# Patient Record
Sex: Male | Born: 1985
Health system: Southern US, Community
[De-identification: ages and names within clinical notes are randomized; demographics above are authoritative.]

---

## 2007-12-14 ENCOUNTER — Emergency Department (HOSPITAL_COMMUNITY): Admission: EM | Admit: 2007-12-14 | Discharge: 2007-12-14 | Payer: Self-pay | Admitting: Emergency Medicine

## 2009-11-22 IMAGING — CR DG KNEE COMPLETE 4+V*R*
4 series · 4 of 4 positions shown · non-contrast
Comparison: None

CLINICAL DATA: History given of injury from fall with pain.
History given of abrasion of the anterior aspect of the knee.

RIGHT KNEE - COMPLETE 4+ VIEW

[t knee ap right]
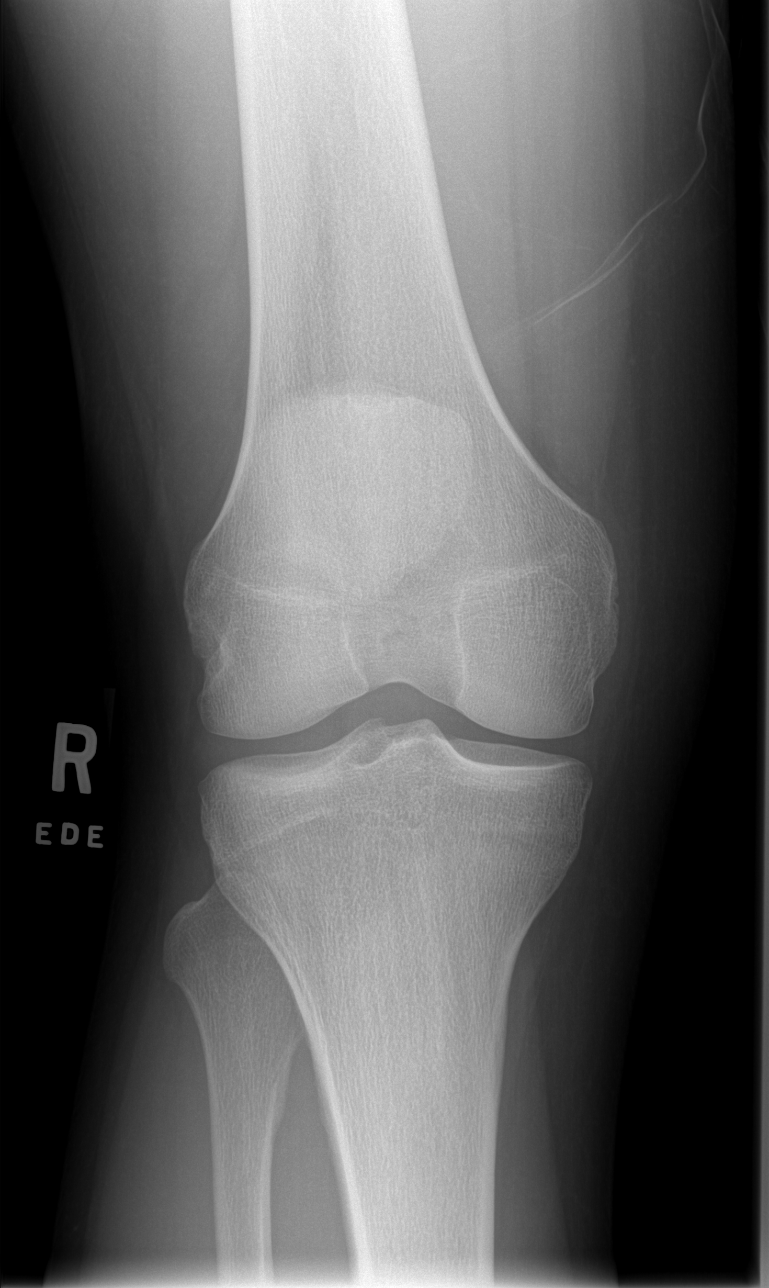

[t knee oblique right (1 of 2)]
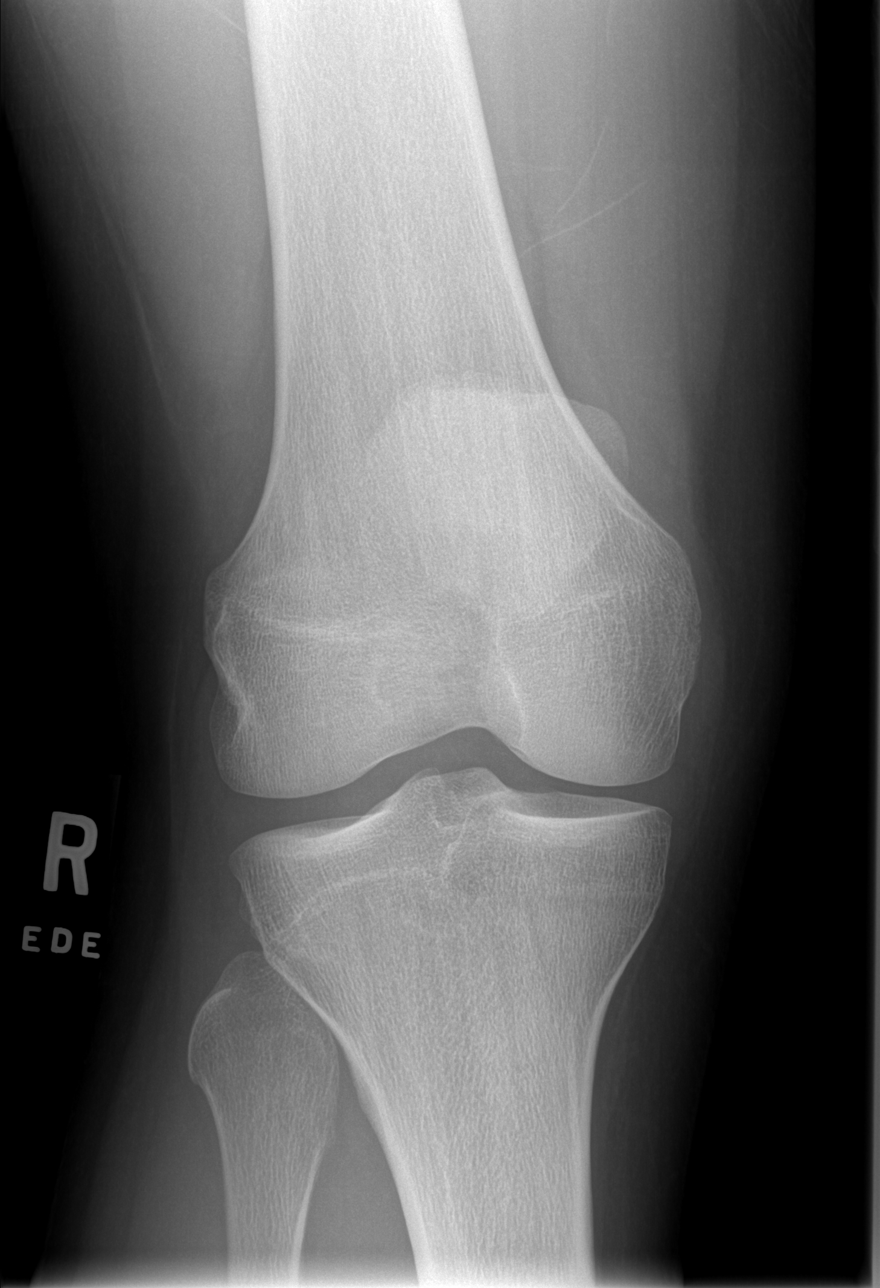

[t knee oblique right (2 of 2)]
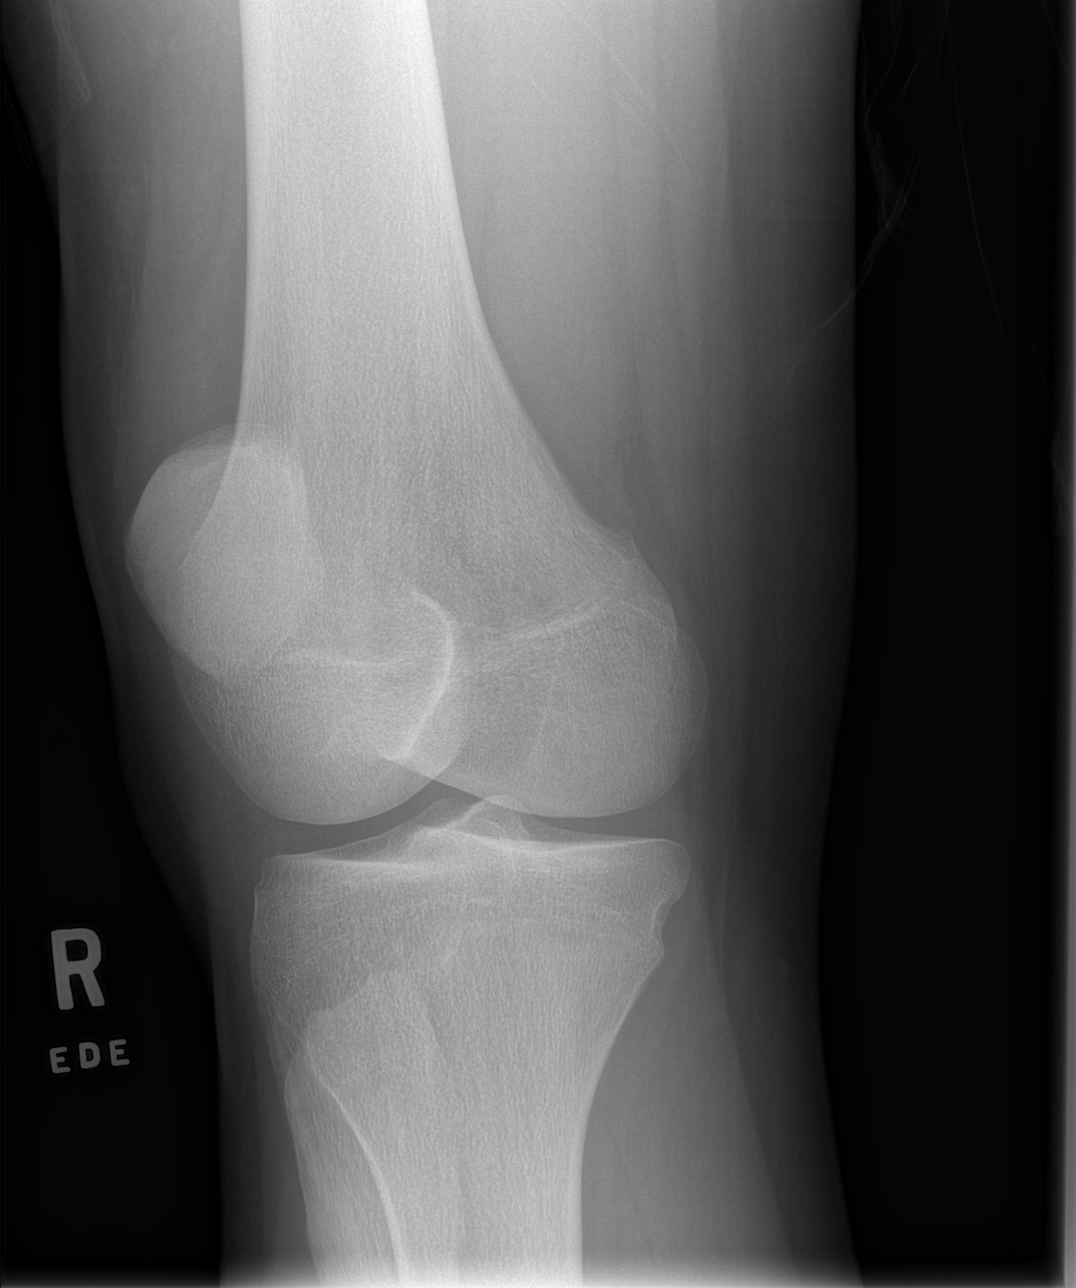

[t knee lat right]
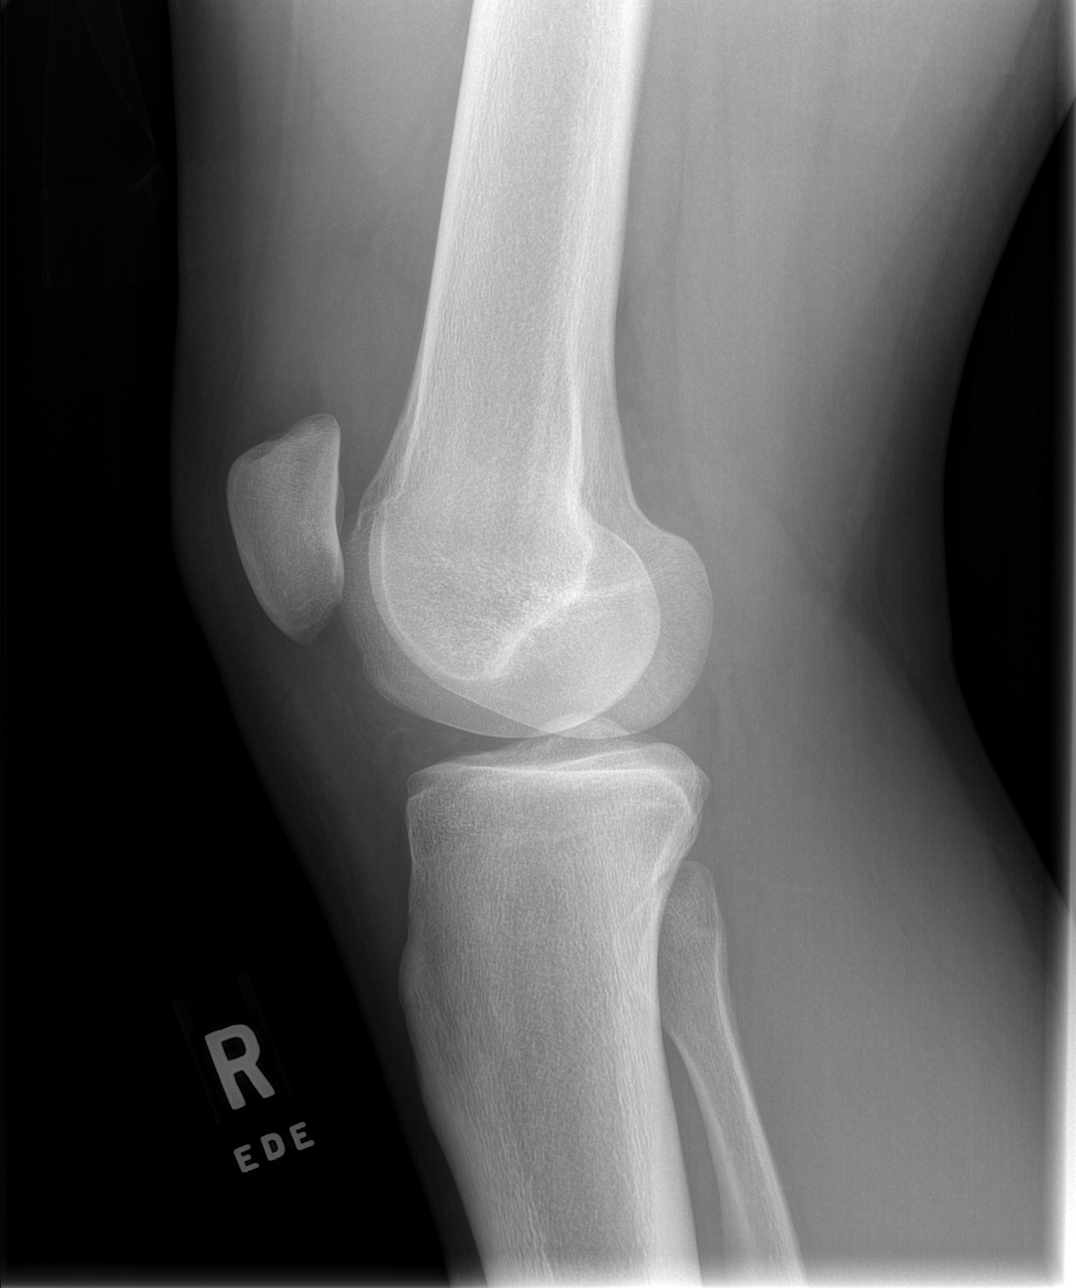

[4 of 4 positions shown; findings below may reference images not displayed]

FINDINGS: There is fullness in the anterior suprapatellar region of
the soft tissues consistent with joint effusion.  Alignment is
normal.  Joint spaces are preserved.  No fracture or dislocation is
seen.
IMPRESSION: Joint effusion.  No fracture or dislocation.  No opaque foreign
bodies seen.

## 2016-11-02 ENCOUNTER — Ambulatory Visit: Payer: Self-pay | Admitting: Family

## 2016-11-02 VITALS — BP 110/80 | HR 66 | Temp 98.5°F | Ht 77.0 in | Wt 281.0 lb

## 2016-11-02 DIAGNOSIS — Z0000 Encounter for general adult medical examination without abnormal findings: Secondary | ICD-10-CM

## 2016-11-02 NOTE — Progress Notes (Signed)
S/ 30  Y/ o [employer] x 3 yrs. C/o difficulty loosing wt. Lifts wts , little cardio. HDL under 40 , NO other complaints.  O/ VSS pleasant healthy ,ENT wnl, neck supple heart rsr lungs clear  abd nontender A/ wellness exam P Sensible eating , portion control, reading labels and increase cardio encourged.Is aware of [employer], wife uses. F/u prn

## 2017-07-27 ENCOUNTER — Ambulatory Visit: Payer: Self-pay | Admitting: Emergency Medicine

## 2017-07-27 VITALS — BP 136/74 | HR 100 | Temp 99.9°F | Resp 16

## 2017-07-27 DIAGNOSIS — J209 Acute bronchitis, unspecified: Secondary | ICD-10-CM

## 2017-07-27 DIAGNOSIS — J0100 Acute maxillary sinusitis, unspecified: Secondary | ICD-10-CM

## 2017-07-27 MED ORDER — PREDNISONE 50 MG PO TABS: 50.0000 mg | ORAL_TABLET | Freq: Every day | ORAL | 0 refills | Status: DC

## 2017-07-27 MED ORDER — FLUTICASONE PROPIONATE 50 MCG/ACT NA SUSP: NASAL | 0 refills | Status: DC

## 2017-07-27 MED ORDER — AZITHROMYCIN 250 MG PO TABS: ORAL_TABLET | ORAL | 0 refills | Status: DC

## 2017-07-27 NOTE — Patient Instructions (Addendum)
Sinusitis, Adult Sinusitis is soreness and inflammation of your sinuses. Sinuses are hollow spaces in the bones around your face. Your sinuses are located:  Around your eyes.  In the middle of your forehead.  Behind your nose.  In your cheekbones.  Your sinuses and nasal passages are lined with a stringy fluid (mucus). Mucus normally drains out of your sinuses. When your nasal tissues become inflamed or swollen, the mucus can become trapped or blocked so air cannot flow through your sinuses. This allows bacteria, viruses, and funguses to grow, which leads to infection. Sinusitis can develop quickly and last for 7?10 days (acute) or for more than 12 weeks (chronic). Sinusitis often develops after a cold. What are the causes? This condition is caused by anything that creates swelling in the sinuses or stops mucus from draining, including:  Allergies.  Asthma.  Bacterial or viral infection.  Abnormally shaped bones between the nasal passages.  Nasal growths that contain mucus (nasal polyps).  Narrow sinus openings.  Pollutants, such as chemicals or irritants in the air.  A foreign object stuck in the nose.  A fungal infection. This is rare.  What increases the risk? The following factors may make you more likely to develop this condition:  Having allergies or asthma.  Having had a recent cold or respiratory tract infection.  Having structural deformities or blockages in your nose or sinuses.  Having a weak immune system.  Doing a lot of swimming or diving.  Overusing nasal sprays.  Smoking.  What are the signs or symptoms? The main symptoms of this condition are pain and a feeling of pressure around the affected sinuses. Other symptoms include:  Upper toothache.  Earache.  Headache.  Bad breath.  Decreased sense of smell and taste.  A cough that may get worse at night.  Fatigue.  Fever.  Thick drainage from your nose. The drainage is often green and  it may contain pus (purulent).  Stuffy nose or congestion.  Postnasal drip. This is when extra mucus collects in the throat or back of the nose.  Swelling and warmth over the affected sinuses.  Sore throat.  Sensitivity to light.  How is this diagnosed? This condition is diagnosed based on symptoms, a medical history, and a physical exam. To find out if your condition is acute or chronic, your health care provider may:  Look in your nose for signs of nasal polyps.  Tap over the affected sinus to check for signs of infection.  View the inside of your sinuses using an imaging device that has a light attached (endoscope).  If your health care provider suspects that you have chronic sinusitis, you may also:  Be tested for allergies.  Have a sample of mucus taken from your nose (nasal culture) and checked for bacteria.  Have a mucus sample examined to see if your sinusitis is related to an allergy.  If your sinusitis does not respond to treatment and it lasts longer than 8 weeks, you may have an MRI or CT scan to check your sinuses. These scans also help to determine how severe your infection is. In rare cases, a bone biopsy may be done to rule out more serious types of fungal sinus disease. How is this treated? Treatment for sinusitis depends on the cause and whether your condition is chronic or acute. If a virus is causing your sinusitis, your symptoms will go away on their own within 10 days. You may be given medicines to relieve your symptoms,   including:  Topical nasal decongestants. They shrink swollen nasal passages and let mucus drain from your sinuses.  Antihistamines. These drugs block inflammation that is triggered by allergies. This can help to ease swelling in your nose and sinuses.  Topical nasal corticosteroids. These are nasal sprays that ease inflammation and swelling in your nose and sinuses.  Nasal saline washes. These rinses can help to get rid of thick mucus in  your nose.  If your condition is caused by bacteria, you will be given an antibiotic medicine. If your condition is caused by a fungus, you will be given an antifungal medicine. Surgery may be needed to correct underlying conditions, such as narrow nasal passages. Surgery may also be needed to remove polyps. Follow these instructions at home: Medicines  Take, use, or apply over-the-counter and prescription medicines only as told by your health care provider. These may include nasal sprays.  If you were prescribed an antibiotic medicine, take it as told by your health care provider. Do not stop taking the antibiotic even if you start to feel better. Hydrate and Humidify  Drink enough water to keep your urine clear or pale yellow. Staying hydrated will help to thin your mucus.  Use a cool mist humidifier to keep the humidity level in your home above 50%.  Inhale steam for 10-15 minutes, 3-4 times a day or as told by your health care provider. You can do this in the bathroom while a hot shower is running.  Limit your exposure to cool or dry air. Rest  Rest as much as possible.  Sleep with your head raised (elevated).  Make sure to get enough sleep each night. General instructions  Apply a warm, moist washcloth to your face 3-4 times a day or as told by your health care provider. This will help with discomfort.  Wash your hands often with soap and water to reduce your exposure to viruses and other germs. If soap and water are not available, use hand sanitizer.  Do not smoke. Avoid being around people who are smoking (secondhand smoke).  Keep all follow-up visits as told by your health care provider. This is important. Contact a health care provider if:  You have a fever.  Your symptoms get worse.  Your symptoms do not improve within 10 days. Get help right away if:  You have a severe headache.  You have persistent vomiting.  You have pain or swelling around your face or  eyes.  You have vision problems.  You develop confusion.  Your neck is stiff.  You have trouble breathing. This information is not intended to replace advice given to you by your health care provider. Make sure you discuss any questions you have with your health care provider. Document Released: 05/24/2005 Document Revised: 01/18/2016 Document Reviewed: 03/19/2015 Elsevier Interactive Patient Education  2018 [employer].  Acute Bronchitis, Adult Acute bronchitis is when air tubes (bronchi) in the lungs suddenly get swollen. The condition can make it hard to breathe. It can also cause these symptoms:  A cough.  Coughing up clear, yellow, or green mucus.  Wheezing.  Chest congestion.  Shortness of breath.  A fever.  Body aches.  Chills.  A sore throat.  Follow these instructions at home: Medicines  Take over-the-counter and prescription medicines only as told by your doctor.  If you were prescribed an antibiotic medicine, take it as told by your doctor. Do not stop taking the antibiotic even if you start to feel better. General instructions  Rest.  Drink enough fluids to keep your pee (urine) clear or pale yellow.  Avoid smoking and secondhand smoke. If you smoke and you need help quitting, ask your doctor. Quitting will help your lungs heal faster.  Use an inhaler, cool mist vaporizer, or humidifier as told by your doctor.  Keep all follow-up visits as told by your doctor. This is important. How is this prevented? To lower your risk of getting this condition again:  Wash your hands often with soap and water. If you cannot use soap and water, use hand sanitizer.  Avoid contact with people who have cold symptoms.  Try not to touch your hands to your mouth, nose, or eyes.  Make sure to get the flu shot every year.  Contact a doctor if:  Your symptoms do not get better in 2 weeks. Get help right away if:  You cough up blood.  You have chest  pain.  You have very bad shortness of breath.  You become dehydrated.  You faint (pass out) or keep feeling like you are going to pass out.  You keep throwing up (vomiting).  You have a very bad headache.  Your fever or chills gets worse. This information is not intended to replace advice given to you by your health care provider. Make sure you discuss any questions you have with your health care provider. Document Released: 11/10/2007 Document Revised: 12/31/2015 Document Reviewed: 11/12/2015 Elsevier Interactive Patient Education  [employer]2018 [employer]

## 2017-07-27 NOTE — Progress Notes (Signed)
Subjective: 4 weeks ago, started with sinus congestion, mild discolored rhinorrhea, without fever. Then, about 7 days ago, new symptoms of severe sinus congestion, discolored rhinorrhea, facial pain, low-grade fever without definite chills, associated with progression to chest congestion and cough occasionally productive of discolored sputum. No history of chronic lung disease. He denies sore throat, nausea or vomiting or chest pain or shortness of breath or abdominal pain or diarrhea or GU symptoms. Meds: None Past medical history negative for chronic disease. I questioned him about medication allergies, he states was told that he had penicillin allergy as a child and is never since taken penicillin.  Objective: Vital signs noted. Temp 99.9, repeated pulse 88, regular. In general: Pleasant male, appears mildly ill but not toxic. No acute cardiorespiratory distress. Occasional mild cough noted. HEENT: TMs normal except minimal air-fluid levels bilaterally. Nose: Boggy turbinates with seromucoid drainage Face: Mildly tender over maxillary sinuses bilaterally. Oropharynx: Clear without redness or lesions. Airway intact. No exudate Neck: Supple, nontender. No adenopathy. Lungs: Few anterior rhonchi. No wheezes except scant late expiratory wheeze heard once anteriorly on forced expiration. Lungs otherwise clear. Breath sounds equal and equal expansion bilaterally. No rhonchi or rales. Heart, regular rate and rhythm Skin: Warm, dry, no rash  Assessment: Likely had viral prodrome and/or seasonal allergy prodrome starting 4 weeks ago, but new symptoms the past 1 week are likely bacterial bronchitis and bacterial sinusitis.  Plans: Treatment options discussed, as well as risks, benefits, alternatives. Patient voiced understanding and agreement with the following plans: New Prescriptions   AZITHROMYCIN (ZITHROMAX Z-PAK) 250 MG TABLET    Take 2 tablets on day one, then 1 tablet daily on days 2  through 5   FLUTICASONE (FLONASE) 50 MCG/ACT NASAL SPRAY    1 or 2 sprays each nostril twice a day   PREDNISONE (DELTASONE) 50 MG TABLET    Take 1 tablet (50 mg total) by mouth daily. With food for 5 days.   Other symptomatic care discussed. An After Visit Summary was printed and given to the patient. Follow-up with your primary care doctor in 5-7 days if not improving, or sooner if symptoms become worse. Precautions discussed. Red flags discussed. Questions invited and answered. Patient voiced understanding and agreement.

## 2017-09-13 ENCOUNTER — Encounter: Payer: Self-pay | Admitting: Emergency Medicine

## 2017-09-13 ENCOUNTER — Emergency Department
Admission: EM | Admit: 2017-09-13 | Discharge: 2017-09-13 | Disposition: A | Discharge status: Home / Self Care | Payer: No Typology Code available for payment source | Attending: Emergency Medicine | Admitting: Emergency Medicine

## 2017-09-13 DIAGNOSIS — Y9289 Other specified places as the place of occurrence of the external cause: Secondary | ICD-10-CM | POA: Diagnosis not present

## 2017-09-13 DIAGNOSIS — Y990 Civilian activity done for income or pay: Secondary | ICD-10-CM | POA: Insufficient documentation

## 2017-09-13 DIAGNOSIS — S6992XA Unspecified injury of left wrist, hand and finger(s), initial encounter: Secondary | ICD-10-CM | POA: Diagnosis present

## 2017-09-13 DIAGNOSIS — Z7721 Contact with and (suspected) exposure to potentially hazardous body fluids: Secondary | ICD-10-CM | POA: Diagnosis not present

## 2017-09-13 DIAGNOSIS — W25XXXA Contact with sharp glass, initial encounter: Secondary | ICD-10-CM | POA: Diagnosis not present

## 2017-09-13 DIAGNOSIS — Y9389 Activity, other specified: Secondary | ICD-10-CM | POA: Insufficient documentation

## 2017-09-13 DIAGNOSIS — S61217A Laceration without foreign body of left little finger without damage to nail, initial encounter: Secondary | ICD-10-CM | POA: Insufficient documentation

## 2017-09-13 DIAGNOSIS — Z23 Encounter for immunization: Secondary | ICD-10-CM | POA: Insufficient documentation

## 2017-09-13 LAB — COMPREHENSIVE METABOLIC PANEL
ALBUMIN: 4.7 g/dL (ref 3.5–5.0)
ALT: 44 U/L (ref 17–63)
ANION GAP: 7 (ref 5–15)
AST: 34 U/L (ref 15–41)
Alkaline Phosphatase: 67 U/L (ref 38–126)
BUN: 17 mg/dL (ref 6–20)
CO2: 25 mmol/L (ref 22–32)
Calcium: 9.3 mg/dL (ref 8.9–10.3)
Chloride: 106 mmol/L (ref 101–111)
Creatinine, Ser: 0.98 mg/dL (ref 0.61–1.24)
GFR calc non Af Amer: >60 mL/min (ref >60)
GLUCOSE: 103 mg/dL — AB (ref 65–99)
POTASSIUM: 3.9 mmol/L (ref 3.5–5.1)
SODIUM: 138 mmol/L (ref 135–145)
Total Bilirubin: 0.9 mg/dL (ref 0.3–1.2)
Total Protein: 7.6 g/dL (ref 6.5–8.1)

## 2017-09-13 LAB — CBC WITH DIFFERENTIAL/PLATELET
BASOS PCT: 1 %
Basophils Absolute: 0 10*3/uL (ref 0–0.1)
EOS ABS: 0.1 10*3/uL (ref 0–0.7)
Eosinophils Relative: 2 %
HCT: 47.3 % (ref 40.0–52.0)
Hemoglobin: 16.1 g/dL (ref 13.0–18.0)
Lymphocytes Relative: 28 %
Lymphs Abs: 1.9 10*3/uL (ref 1.0–3.6)
MCH: 29 pg (ref 26.0–34.0)
MCHC: 33.9 g/dL (ref 32.0–36.0)
MCV: 85.6 fL (ref 80.0–100.0)
MONO ABS: 0.5 10*3/uL (ref 0.2–1.0)
MONOS PCT: 7 %
Neutro Abs: 4.3 10*3/uL (ref 1.4–6.5)
Neutrophils Relative %: 62 %
Platelets: 202 10*3/uL (ref 150–440)
RBC: 5.53 MIL/uL (ref 4.40–5.90)
RDW: 13.6 % (ref 11.5–14.5)
WBC: 6.8 10*3/uL (ref 3.8–10.6)

## 2017-09-13 MED ORDER — TETANUS-DIPHTH-ACELL PERTUSSIS 5-2.5-18.5 LF-MCG/0.5 IM SUSP
0.5000 mL | Freq: Once | INTRAMUSCULAR | Status: AC
  Administered 2017-09-13: 0.5 mL via INTRAMUSCULAR
  Filled 2017-09-13: qty 0.5

## 2017-09-13 NOTE — ED Triage Notes (Signed)
States he was trying to get someone out of the car  And he got a small skin tear to left 5 th finger  The person was positive for hep C

## 2017-09-13 NOTE — ED Provider Notes (Signed)
[facility] Emergency Department Provider Note  ___________________________________________   First MD Initiated Contact with Patient 09/13/17 1320     (approximate)  I have reviewed the triage vital signs and the nursing notes.   HISTORY  Chief Complaint Hand Injury  HPI Joshua Hayden is a 32 y.o. male is here with a Workmen's Comp. injury.  Patient states that he was getting someone out of his car and was exposed to the suspect's blood.  Suspect is known to be hep C positive.  Patient while getting the suspect out of the car had a small flap tear to his left fifth finger.  He believes that this occurred when he reached in the suspects glass window.  He immediately washed his hands and states that his finger bled quite a bit.  Bleeding is now under control.  Patient is unsure of his last tetanus immunization.  He denies any pain with his finger at this time.  History reviewed. No pertinent past medical history.  There are no active problems to display for this patient.   History reviewed. No pertinent surgical history.  Prior to Admission medications   Not on File    Allergies Penicillins  No family history on file.  Social History Social History   Tobacco Use  . Smoking status: Never Smoker  . Smokeless tobacco: Never Used  Substance Use Topics  . Alcohol use: Not on file  . Drug use: Not on file    Review of Systems Constitutional: No fever/chills Cardiovascular: Denies chest pain. Respiratory: Denies shortness of breath. Skin: Positive for laceration left hand. Neurological: Negative for headaches, focal weakness or numbness. ____________________________________________   PHYSICAL EXAM:  VITAL SIGNS: ED Triage Vitals  Enc Vitals Group     BP 09/13/17 1231 (!) 147/79     Pulse Rate 09/13/17 1231 68     Resp --      Temp 09/13/17 1231 97.7 F (36.5 C)     Temp Source 09/13/17 1231 Oral     SpO2 09/13/17 1231 95 %     Weight  09/13/17 1232 275 lb (124.7 kg)     Height 09/13/17 1232 6\' 5"  (1.956 m)     Head Circumference --      Peak Flow --      Pain Score 09/13/17 1237 0     Pain Loc --      Pain Edu? --      Excl. in GC? --    Constitutional: Alert and oriented. Well appearing and in no acute distress. Eyes: Conjunctivae are normal.  Head: Atraumatic. Neck: No stridor.   Cardiovascular: Normal rate, regular rhythm. Grossly normal heart sounds.  Good peripheral circulation. Respiratory: Normal respiratory effort.  No retractions. Lungs CTAB. Musculoskeletal: No lower extremity tenderness nor edema.  No joint effusions. Neurologic:  Normal speech and language. No gross focal neurologic deficits are appreciated.  Skin:  Skin is warm, dry.  There is a 1 cm flap laceration to the left fifth finger lateral aspect.  No active bleeding and no foreign body was noted.  Flap is in good alignment and superficial.  No sutures were indicated.  Nail is not involved with the injury. Psychiatric: Mood and affect are normal. Speech and behavior are normal. ____________________________________________   LABS (all labs ordered are listed, but only abnormal results are displayed)  Labs Reviewed  COMPREHENSIVE METABOLIC PANEL - Abnormal; Notable for the following components:      Result Value   Glucose,  Bld 103 (*)    All other components within normal limits  CBC WITH DIFFERENTIAL/PLATELET    PROCEDURES  Procedure(s) performed: None  Procedures  Critical Care performed: No  ____________________________________________   INITIAL IMPRESSION / ASSESSMENT AND PLAN / ED COURSE Patient here after receiving a flap laceration to his left fifth finger while in the line of duty.  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  Suspect that patient was dealing with is known to be positive for hep C.  Baseline labs were drawn on the patient.  Patient is to follow-up with the provider responsible [facility] department  for his [employer]. injury.  Dressing was applied and patient is to watch area for any signs of infection.  ____________________________________________   FINAL CLINICAL IMPRESSION(S) / ED DIAGNOSES  Final diagnoses:  Laceration of left little finger without foreign body without damage to nail, initial encounter  Exposure to blood-borne pathogen     ED Discharge Orders    None       Note:  This document was prepared using Dragon voice recognition software and may include unintentional dictation errors.    Tommi Rumps, PA-C 09/13/17 1617    Minna Antis, MD 09/16/17 1118

## 2017-09-13 NOTE — ED Notes (Signed)
Pt verbalized understanding of discharge instructions. NAD at this time. 

## 2017-09-13 NOTE — Discharge Instructions (Signed)
Follow-up with your Workmen's Comp. doctor.  If lab work that we did today is positive for anything you will be contacted immediately for prophylactic medication. Keep wound clean and dry.  Watch for any signs of infection.  If you see any signs of infection follow-up with your Workmen's Comp. doctor immediately.

## 2017-11-11 ENCOUNTER — Ambulatory Visit: Payer: Self-pay | Admitting: Family Medicine

## 2017-11-11 VITALS — BP 129/80 | HR 78 | Resp 16 | Ht 77.0 in | Wt 290.0 lb

## 2017-11-11 DIAGNOSIS — Z008 Encounter for other general examination: Secondary | ICD-10-CM

## 2017-11-11 DIAGNOSIS — Z0189 Encounter for other specified special examinations: Principal | ICD-10-CM

## 2017-11-11 NOTE — Progress Notes (Signed)
Subjective: Annual biometrics screening  Patient presents for his annual biometric screening. Patient reports eating a healthy, well-rounded diet and getting regular exercise.  Patient does not regularly see a primary care provider. PCP: None currently. 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 Patient denies any other issues or concerns.   Review of Systems Unremarkable  Objective  Physical Exam General: Awake, alert and oriented. No acute distress. Well developed, hydrated and nourished. Appears stated age.  HEENT: Supple neck without adenopathy. Sclera is non-icteric. The ear canal is clear without discharge. The tympanic membrane is normal in appearance with normal landmarks and cone of light. Nasal mucosa is pink and moist. Oral mucosa is pink and moist. The pharynx is normal in appearance without tonsillar swelling or exudates.  Skin: Skin in warm, dry and intact without rashes or lesions. Appropriate color for ethnicity. Cardiac: Heart rate and rhythm are normal. No murmurs, gallops, or rubs are auscultated.  Respiratory: The chest wall is symmetric and without deformity. No signs of respiratory distress. Lung sounds are clear in all lobes bilaterally without rales, ronchi, or wheezes.  Neurological: The patient is awake, alert and oriented to person, place, and time with normal speech.  Memory is normal and thought processes intact. No gait abnormalities are appreciated.  Psychiatric: Appropriate mood and affect.   Assessment Annual biometrics screening   Plan  Lipid panel and fasting blood sugar pending. Encouraged routine visits with primary care provider.  Provided patient with local resources and encouraged him to establish care with a new primary care provider. Encouraged patient to get regular exercise and eat a healthy, well-rounded diet.

## 2017-11-12 LAB — LIPID PANEL
CHOL/HDL RATIO: 4.9 ratio (ref 0.0–5.0)
CHOLESTEROL TOTAL: 183 mg/dL (ref 100–199)
HDL: 37 mg/dL — AB (ref >39)
LDL Calculated: 128 mg/dL — ABNORMAL HIGH (ref 0–99)
TRIGLYCERIDES: 89 mg/dL (ref 0–149)
VLDL Cholesterol Cal: 18 mg/dL (ref 5–40)

## 2017-11-12 LAB — GLUCOSE, RANDOM: Glucose: 95 mg/dL (ref 65–99)

## 2017-11-14 NOTE — Progress Notes (Signed)
Carollee HerterShannon, Will you call the patient and inform them that their lipid panel and fasting blood sugar came back?  Everything is normal, with the exception of his HDL cholesterol and LDL cholesterol. The HDL cholesterol ("good cholesterol") is decreased at 37, normal values are above 39.  The LDL cholesterol ("bad cholesterol") is elevated at 128, normal values are below 99. Please advise the patient to follow-up with their primary care provider regarding these results.

## 2018-12-27 ENCOUNTER — Other Ambulatory Visit: Payer: Self-pay

## 2018-12-27 ENCOUNTER — Ambulatory Visit: Payer: Managed Care, Other (non HMO) | Admitting: Adult Health

## 2018-12-27 ENCOUNTER — Encounter: Payer: Self-pay | Admitting: Adult Health

## 2018-12-27 VITALS — BP 128/82 | HR 77 | Temp 98.3°F | Resp 16 | Ht 77.0 in | Wt 289.0 lb

## 2018-12-27 DIAGNOSIS — Z008 Encounter for other general examination: Secondary | ICD-10-CM | POA: Diagnosis not present

## 2018-12-27 DIAGNOSIS — Z0189 Encounter for other specified special examinations: Secondary | ICD-10-CM

## 2018-12-27 NOTE — Patient Instructions (Signed)
The Biometric exam is a brief physical with labs including glucose and cholesterol. This does not replace a full physical with a primary care provider, and additional recommended labs. This is an acute care clinic not for maintenance of chronic or long standing conditions.   Provider also recommends if you do not have a primary care provider for patient to establish care promptly.You can choose any provider of your choice at any facility of your choice, the below information is  just a resource to aid in you finding a primary care provider for routine health maintenance.   [place]  PHYSICIAN/PROVIDER  REFERRAL LINE at [phone]  WWW..COM to help assist with finding a primary care doctor.   Helpful resources below of other primary care office's accepting new patients.   . [facility]         [address]  [place]. [place] [address] [phone]  . [facility]    [address], [address] [place], [place] [address] [phone]  . [facility] [address]  [place], [place]  [phone]   . [facility]  [address], [address]  [place] [address] [phone]    Follow up with primary care as needed for chronic and maintenance health care- can be seen in this employee clinic for acute care.     Health Maintenance, Male Adopting a healthy lifestyle and getting preventive care are important in promoting health and wellness. Ask your health care provider about:  The right schedule for you to have regular tests and exams.  Things you can do on your own to prevent diseases and keep yourself healthy. What should I know about diet, weight, and exercise? Eat a healthy diet   Eat a diet that includes plenty of vegetables, fruits, low-fat dairy products, and lean protein.  Do not eat a lot of foods that are high in solid fats, added sugars, or sodium.  Maintain a healthy weight Body mass index (BMI) is a measurement that can be used to identify possible weight problems. It estimates body fat based on height and weight. Your health care provider can help determine your BMI and help you achieve or maintain a healthy weight. Get regular exercise Get regular exercise. This is one of the most important things you can do for your health. Most adults should:  Exercise for at least 150 minutes each week. The exercise should increase your heart rate and make you sweat (moderate-intensity exercise).  Do strengthening exercises at least twice a week. This is in addition to the moderate-intensity exercise.  Spend less time sitting. Even light physical activity can be beneficial. Watch cholesterol and blood lipids Have your blood tested for lipids and cholesterol at 33 years of age, then have this test every 5 years. You may need to have your cholesterol levels checked more often if:  Your lipid or cholesterol levels are high.  You are older than 33 years of age.  You are at high risk for heart disease. What should I know about cancer screening? Many types of cancers can be detected early and may often be prevented. Depending on your health history and family history, you may need to have cancer screening at various ages. This may include screening for:  Colorectal cancer.  Prostate cancer.  Skin cancer.  Lung cancer. What should I know about heart disease, diabetes, and high blood pressure? Blood pressure and heart disease    High blood pressure causes heart disease and increases the risk of stroke. This is more likely to develop in people who have high blood pressure readings, are of African descent, or are overweight.  Talk with your health care provider about your target blood pressure readings.  Have your blood pressure checked: ? Every 3-5 years if you are 18-39 years of age. ? Every year if you are 40 years old or older.  If you  are between the ages of 65 and 75 and are a current or former smoker, ask your health care provider if you should have a one-time screening for abdominal aortic aneurysm (AAA). Diabetes Have regular diabetes screenings. This checks your fasting blood sugar level. Have the screening done:  Once every three years after age 45 if you are at a normal weight and have a low risk for diabetes.  More often and at a younger age if you are overweight or have a high risk for diabetes. What should I know about preventing infection? Hepatitis B If you have a higher risk for hepatitis B, you should be screened for this virus. Talk with your health care provider to find out if you are at risk for hepatitis B infection. Hepatitis C Blood testing is recommended for:  Everyone born from 1945 through 1965.  Anyone with known risk factors for hepatitis C. Sexually transmitted infections (STIs)  You should be screened each year for STIs, including gonorrhea and chlamydia, if: ? You are sexually active and are younger than 33 years of age. ? You are older than 33 years of age and your health care provider tells you that you are at risk for this type of infection. ? Your sexual activity has changed since you were last screened, and you are at increased risk for chlamydia or gonorrhea. Ask your health care provider if you are at risk.  Ask your health care provider about whether you are at high risk for HIV. Your health care provider may recommend a prescription medicine to help prevent HIV infection. If you choose to take medicine to prevent HIV, you should first get tested for HIV. You should then be tested every 3 months for as long as you are taking the medicine. Follow these instructions at home: Lifestyle  Do not use any products that contain nicotine or tobacco, such as cigarettes, e-cigarettes, and chewing tobacco. If you need help quitting, ask your health care provider.  Do not use street drugs.  Do  not share needles.  Ask your health care provider for help if you need support or information about quitting drugs. Alcohol use  Do not drink alcohol if your health care provider tells you not to drink.  If you drink alcohol: ? Limit how much you have to 0-2 drinks a day. ? Be aware of how much alcohol is in your drink. In the [place] one drink equals one 12 oz bottle of beer (355 mL), one 5 oz glass of wine (148 mL), or one 1 oz glass of hard liquor (44 mL). General instructions  Schedule regular health, dental, and eye exams.  Stay current with your vaccines.  Tell your health care provider if: ? You often feel depressed. ? You have ever been abused or do not feel safe at home. Summary  Adopting a healthy lifestyle and getting preventive care are important in promoting health and wellness.  Follow your health care provider's instructions about healthy diet, exercising, and getting tested or screened for diseases.  Follow   your health care provider's instructions on monitoring your cholesterol and blood pressure. This information is not intended to replace advice given to you by your health care provider. Make sure you discuss any questions you have with your health care provider. Document Released: 11/20/2007 Document Revised: 05/17/2018 Document Reviewed: 05/17/2018 Elsevier Patient Education  2020 Elsevier Inc.  

## 2018-12-27 NOTE — Progress Notes (Signed)
[place] DOB: 33 y.o. MRN: [id number]  Subjective:  Here for Biometric Screen/brief exam He is a 33 year old male here for biometric screening and brief exam. He works for [employer] and is doing well he reports. 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   He tries to remain active and does exercise. His diet is ok but has room for improvement he reports.  He denies any health concerns at this time.   Patient  denies any fever, body aches,chills, rash, chest pain, shortness of breath, nausea, vomiting, or diarrhea.    Objective: Blood pressure 128/82, pulse 77, temperature 98.3 F (36.8 C), temperature source Oral, resp. rate 16, height 6\' 5"  (1.956 m), weight 289 lb (131.1 kg), SpO2 97 %. NAD Patient is alert and oriented and responsive to questions Engages in eye contact with provider. Speaks in full sentences without any pauses without any shortness of breath or distress.   HEENT: Within normal limits Neck: Normal, supple  Heart: Regular rate and rhythm Lungs: Clear with no adventitious lung sounds.  Patient moves on and off of exam table and in room without difficulty. Gait is normal in hall and in room.  Assessment: Biometric screen   Plan:  Follow up with primary care as needed for chronic and maintenance health care- can be seen in this employee clinic for acute care.   Fasting glucose and lipids. Discussed with patient that today's visit here is a limited biometric screening visit (not a comprehensive exam or management of any chronic problems) Discussed some health issues, including healthy eating habits and exercise. Encouraged to follow-up with PCP for annual comprehensive preventive and wellness care (and if applicable, any chronic issues). Questions invited and answered.  The Biometric exam is a brief physical with labs including glucose and cholesterol. This does not  replace a full physical with a primary care provider, and additional recommended labs. This is an acute care clinic not for maintenance of chronic or long standing conditions.   Provider also recommends if you do not have a primary care provider for patient to establish care promptly.You can choose any provider of your choice at any facility of your choice, the below information is  just a resource to aid in you finding a primary care provider for routine health maintenance.   [place]  PHYSICIAN/PROVIDER  REFERRAL LINE at [phone]  [email or web address] to help assist with finding a primary care doctor.   Helpful resources below of other primary care office's accepting new patients.   [facility]         [address]  [place]. [place] [address] [phone]  . [facility]    [address] [place], [place] [address] [phone]  . [facility] [address] [place], [place]  [phone]   . [place] [facility]  [address] [place]  [address] [phone]    Follow up with primary care as needed for chronic and maintenance health care- can be seen in this employee clinic for acute care.

## 2018-12-28 LAB — LIPID PANEL WITH LDL/HDL RATIO
Cholesterol, Total: 213 mg/dL — ABNORMAL HIGH (ref 100–199)
HDL: 36 mg/dL — ABNORMAL LOW (ref >39)
LDL Calculated: 158 mg/dL — ABNORMAL HIGH (ref 0–99)
LDl/HDL Ratio: 4.4 ratio — ABNORMAL HIGH (ref 0.0–3.6)
Triglycerides: 95 mg/dL (ref 0–149)
VLDL Cholesterol Cal: 19 mg/dL (ref 5–40)

## 2018-12-28 LAB — GLUCOSE, RANDOM: Glucose: 86 mg/dL (ref 65–99)

## 2019-01-01 ENCOUNTER — Telehealth: Payer: Self-pay

## 2019-01-01 NOTE — Telephone Encounter (Signed)
Spoke with patient and informed him of his lab results as well as the recommendations of the reviewing Provider Sharyn Lull Flinchum FNP-C. Patient gave verbal understanding

## 2019-01-01 NOTE — Progress Notes (Signed)
Tried calling patient 01/01/19 to review results. No active My chart- code was sent to cell.  Cholesterol elevated 213 as normal range is  (100-199),  Triglycerides elevated normal. HDL your good cholesterol is 36 this is low, as above 39 is normal, and the higher the better for this protective level. Your LDL ( bad cholesterol) is elevated at 158 normal is 0-99. These levels increase your risk of cardiovascular disease/ events in the future. Provider recommends increased exercise daily at least 30 minutes, decreased cholesterol and heart healthy diet and follow up with primary care for regular male health maintenance.

## 2019-01-01 NOTE — Telephone Encounter (Signed)
-----   Message from Doreen Beam, [place] sent at 01/01/2019 10:50 AM EDT ----- Tried calling patient 01/01/19 to review results. No active My chart- code was sent to cell.  Cholesterol elevated 213 as normal range is  (100-199),  Triglycerides elevated normal. HDL your good cholesterol is 36 this is low, as above 39 is normal, and the higher the better for this protective level. Your LDL ( bad cholesterol) is elevated at 158 normal is 0-99. These levels increase your risk of cardiovascular disease/ events in the future. Provider recommends increased exercise daily at least 30 minutes, decreased cholesterol and heart healthy diet and follow up with primary care for regular male health maintenance.

## 2019-10-31 ENCOUNTER — Ambulatory Visit: Payer: Managed Care, Other (non HMO) | Admitting: Emergency Medicine

## 2019-10-31 ENCOUNTER — Encounter: Payer: Managed Care, Other (non HMO) | Admitting: Emergency Medicine

## 2019-10-31 ENCOUNTER — Encounter: Payer: Self-pay | Admitting: Emergency Medicine

## 2019-10-31 ENCOUNTER — Other Ambulatory Visit: Payer: Self-pay

## 2019-10-31 VITALS — BP 140/80 | HR 63 | Temp 97.6°F | Resp 18 | Ht 74.0 in | Wt 290.0 lb

## 2019-10-31 DIAGNOSIS — Z008 Encounter for other general examination: Secondary | ICD-10-CM

## 2019-10-31 NOTE — Progress Notes (Signed)
   I have reviewed the triage vital signs and the nursing notes.   HISTORY  Chief Complaint Biometrics  HPI Joshua Hayden is a 34 y.o. male presents to clinic for a biometric physical. Had recent GB surgery with 100 gallstones.  Did not have any complications.  Just recently returned to work.        History reviewed. No pertinent past medical history.  There are no problems to display for this patient.   History reviewed. No pertinent surgical history.  Prior to Admission medications   Not on File    Allergies Penicillins  History reviewed. No pertinent family history.  Social History Social History   Tobacco Use  . Smoking status: Never Smoker  . Smokeless tobacco: Never Used  Substance Use Topics  . Alcohol use: Not on file  . Drug use: Not on file    Review of Systems Constitutional: No fever/chills Eyes: No visual changes. ENT: No sore throat. Cardiovascular: Denies chest pain. Respiratory: Denies shortness of breath. Gastrointestinal: No abdominal pain.  No nausea, no vomiting.  Musculoskeletal: Negative for muscle skeletal pain. Skin: Negative for rash. Neurological: Negative for headaches, focal weakness or numbness.  ____________________________________________   PHYSICAL EXAM: Constitutional: Alert and oriented. Well appearing and in no acute distress. Eyes: Conjunctivae are normal. PERRL. EOMI. Head: Atraumatic. Neck: No stridor.  Cardiovascular: Normal rate, regular rhythm. Grossly normal heart sounds.  Good peripheral circulation. Respiratory: Normal respiratory effort.  No retractions. Lungs CTAB. Gastrointestinal: Soft and nontender. No distention. Bowel sounds x 4 quadrants within normal limits. Musculoskeletal: Moves upper and lower extremities without any difficulty.  No joint effusions. Neurologic:  Normal speech and language. No gross focal neurologic deficits are appreciated. No gait instability. Skin:  Skin is warm, dry and intact.  No rash noted. Psychiatric: Mood and affect are normal. Speech and behavior are normal.   FINAL CLINICAL IMPRESSION(S)  Normal male biometric exam   ED Discharge Orders         Ordered    Lipid panel     Pending    Glucose, random     Pending           Note:  This document was prepared using Dragon voice recognition software and may include unintentional dictation errors.

## 2019-11-01 LAB — LIPID PANEL
Chol/HDL Ratio: 5.3 ratio — ABNORMAL HIGH (ref 0.0–5.0)
Cholesterol, Total: 196 mg/dL (ref 100–199)
HDL: 37 mg/dL — ABNORMAL LOW (ref >39)
LDL Chol Calc (NIH): 136 mg/dL — ABNORMAL HIGH (ref 0–99)
Triglycerides: 127 mg/dL (ref 0–149)
VLDL Cholesterol Cal: 23 mg/dL (ref 5–40)

## 2019-11-01 LAB — GLUCOSE, RANDOM: Glucose: 93 mg/dL (ref 65–99)

## 2020-05-13 ENCOUNTER — Emergency Department
Admission: EM | Admit: 2020-05-13 | Discharge: 2020-05-13 | Disposition: A | Discharge status: Home / Self Care | Payer: BC Managed Care – PPO | Attending: Emergency Medicine | Admitting: Emergency Medicine

## 2020-05-13 ENCOUNTER — Other Ambulatory Visit: Payer: Self-pay

## 2020-05-13 DIAGNOSIS — Z203 Contact with and (suspected) exposure to rabies: Secondary | ICD-10-CM | POA: Insufficient documentation

## 2020-05-13 DIAGNOSIS — Z23 Encounter for immunization: Secondary | ICD-10-CM | POA: Insufficient documentation

## 2020-05-13 DIAGNOSIS — Z2914 Encounter for prophylactic rabies immune globin: Secondary | ICD-10-CM | POA: Diagnosis not present

## 2020-05-13 MED ORDER — RABIES IMMUNE GLOBULIN 150 UNIT/ML IM INJ
20.0000 [IU]/kg | INJECTION | Freq: Once | INTRAMUSCULAR | Status: AC
Start: 2020-05-13 — End: 2020-05-13
  Administered 2020-05-13: 2625 [IU] via INTRAMUSCULAR
  Filled 2020-05-13: qty 17.5

## 2020-05-13 MED ORDER — RABIES VACCINE, PCEC IM SUSR
1.0000 mL | Freq: Once | INTRAMUSCULAR | Status: AC
  Administered 2020-05-13: 1 mL via INTRAMUSCULAR
  Filled 2020-05-13: qty 1

## 2020-05-13 NOTE — ED Notes (Signed)
Pharmacy called and will send up medications

## 2020-05-13 NOTE — Discharge Instructions (Signed)
Please follow-up with [facility] or the emergency department on 12/10, 12/14, 12/21 for continuation of rabies vaccines.

## 2020-05-13 NOTE — ED Provider Notes (Signed)
[facility] Emergency Department Provider Note  ____________________________________________  Time seen: Approximately 5:45 PM  I have reviewed the triage vital signs and the nursing notes.   HISTORY  Chief Complaint Rabies Injection    HPI Joshua Hayden is a 34 y.o. male that presents to the emergency department for evaluation of rabies exposure.  Patient got scratched by a feral cat to his hand.  He does not believe that the cat bit him but cannot be sure.  There has been no swelling or drainage to hand.  He received a phone call from the sheriff's department stating that the cat tested positive for rabies.  He last received his tetanus vaccination 2 years ago in this emergency department.  History reviewed. No pertinent past medical history.  There are no problems to display for this patient.   History reviewed. No pertinent surgical history.  Prior to Admission medications   Not on File    Allergies Penicillins  History reviewed. No pertinent family history.  Social History Social History   Tobacco Use  . Smoking status: Never Smoker  . Smokeless tobacco: Never Used  Substance Use Topics  . Alcohol use: Not on file  . Drug use: Not on file     Review of Systems  Cardiovascular: No chest pain. Respiratory: No SOB. Gastrointestinal: No abdominal pain.  No nausea, no vomiting.  Musculoskeletal: Negative for musculoskeletal pain. Skin: Negative for rash, abrasions, lacerations, ecchymosis. Positive for scratch.   ____________________________________________   PHYSICAL EXAM:  VITAL SIGNS: ED Triage Vitals [05/13/20 1540]  Enc Vitals Group     BP (!) 180/97     Pulse Rate 68     Resp 18     Temp 98.3 F (36.8 C)     Temp Source Oral     SpO2 98 %     Weight 290 lb (131.5 kg)     Height 6\' 5"  (1.956 m)     Head Circumference      Peak Flow      Pain Score 0     Pain Loc      Pain Edu?      Excl. in GC?       Constitutional: Alert and oriented. Well appearing and in no acute distress. Eyes: Conjunctivae are normal. PERRL. EOMI. Head: Atraumatic. ENT:      Ears:      Nose: No congestion/rhinnorhea.      Mouth/Throat: Mucous membranes are moist.  Neck: No stridor.   Cardiovascular: Normal rate, regular rhythm.  Good peripheral circulation. Respiratory: Normal respiratory effort without tachypnea or retractions. Lungs CTAB. Good air entry to the bases with no decreased or absent breath sounds. Musculoskeletal: Full range of motion to all extremities. No gross deformities appreciated. Neurologic:  Normal speech and language. No gross focal neurologic deficits are appreciated.  Skin:  Skin is warm, dry.  Small scratches to hand. Psychiatric: Mood and affect are normal. Speech and behavior are normal. Patient exhibits appropriate insight and judgement.   ____________________________________________   LABS (all labs ordered are listed, but only abnormal results are displayed)  Labs Reviewed - No data to display ____________________________________________  EKG   ____________________________________________  RADIOLOGY   No results found.  ____________________________________________    PROCEDURES  Procedure(s) performed:    Procedures    Medications  rabies vaccine (RABAVERT) injection 1 mL (1 mL Intramuscular Given 05/13/20 1756)  rabies immune globulin (HYPERAB/KEDRAB) injection 2,625 Units (2,625 Units Intramuscular Given 05/13/20 1759)  ____________________________________________   INITIAL IMPRESSION / ASSESSMENT AND PLAN / ED COURSE  Pertinent labs & imaging results that were available during my care of the patient were reviewed by me and considered in my medical decision making (see chart for details).  Review of the [place] CSRS was performed in accordance of the [facility] prior to dispensing any controlled drugs.    Patient's diagnosis is consistent with  rabies exposure.  Vital signs and exam are reassuring.  Patient received the rabies vaccine and immunoglobulin in the emergency department.  His tetanus shot is up-to-date.  No indication of infection. Patient is to follow up with emergency department or urgent care as directed for continued rabies vaccines. Patient is given ED precautions to return to the ED for any worsening or new symptoms.  Joshua Hayden was evaluated in Emergency Department on 05/13/2020 for the symptoms described in the history of present illness. He was evaluated in the context of the global COVID-19 pandemic, which necessitated consideration that the patient might be at risk for infection with the SARS-CoV-2 virus that causes COVID-19. Institutional protocols and algorithms that pertain to the evaluation of patients at risk for COVID-19 are in a state of rapid change based on information released by regulatory bodies including the CDC and federal and state organizations. These policies and algorithms were followed during the patient's care in the ED.   ____________________________________________  FINAL CLINICAL IMPRESSION(S) / ED DIAGNOSES  Final diagnoses:  Rabies exposure      NEW MEDICATIONS STARTED DURING THIS VISIT:  ED Discharge Orders    None          This chart was dictated using voice recognition software/Dragon. Despite best efforts to proofread, errors can occur which can change the meaning. Any change was purely unintentional.    Enid Derry, PA-C 05/13/20 1846    Arnaldo Natal, MD 05/13/20 2039

## 2020-05-13 NOTE — ED Notes (Signed)
Pt here for rabies injection following a cat scratch and cat tested positive for rabies.

## 2020-05-13 NOTE — ED Triage Notes (Signed)
Pt got scratched by a cat that tested positive for rabies. Scratch happened Saturday.

## 2020-05-16 ENCOUNTER — Ambulatory Visit
Admission: EM | Admit: 2020-05-16 | Discharge: 2020-05-16 | Disposition: A | Discharge status: Home / Self Care | Payer: BC Managed Care – PPO

## 2020-05-16 ENCOUNTER — Other Ambulatory Visit: Payer: Self-pay

## 2020-05-16 DIAGNOSIS — Z203 Contact with and (suspected) exposure to rabies: Secondary | ICD-10-CM

## 2020-05-16 NOTE — ED Triage Notes (Signed)
Pt here for administration of day 3 rabies

## 2020-05-20 ENCOUNTER — Ambulatory Visit
Admission: EM | Admit: 2020-05-20 | Discharge: 2020-05-20 | Disposition: A | Discharge status: Home / Self Care | Payer: BC Managed Care – PPO | Attending: Emergency Medicine | Admitting: Emergency Medicine

## 2020-05-20 ENCOUNTER — Other Ambulatory Visit: Payer: Self-pay

## 2020-05-20 DIAGNOSIS — Z203 Contact with and (suspected) exposure to rabies: Secondary | ICD-10-CM | POA: Diagnosis not present

## 2020-05-20 MED ORDER — RABIES VACCINE, PCEC IM SUSR
1.0000 mL | Freq: Once | INTRAMUSCULAR | Status: AC
  Administered 2020-05-20: 1 mL via INTRAMUSCULAR

## 2020-05-20 NOTE — ED Triage Notes (Signed)
Pt states here for 3rd rabies shot.

## 2020-05-27 ENCOUNTER — Other Ambulatory Visit: Payer: Self-pay

## 2020-05-27 ENCOUNTER — Ambulatory Visit
Admission: EM | Admit: 2020-05-27 | Discharge: 2020-05-27 | Disposition: A | Discharge status: Home / Self Care | Payer: BC Managed Care – PPO | Attending: Emergency Medicine | Admitting: Emergency Medicine

## 2020-05-27 DIAGNOSIS — Z23 Encounter for immunization: Secondary | ICD-10-CM

## 2020-05-27 MED ORDER — RABIES VACCINE, PCEC IM SUSR
1.0000 mL | Freq: Once | INTRAMUSCULAR | Status: AC
  Administered 2020-05-27: 1 mL via INTRAMUSCULAR

## 2020-05-27 NOTE — ED Triage Notes (Signed)
Pt here for 4th rabies shot 

## 2020-06-03 ENCOUNTER — Ambulatory Visit
Admission: EM | Admit: 2020-06-03 | Discharge: 2020-06-03 | Disposition: A | Discharge status: Home / Self Care | Payer: BC Managed Care – PPO | Attending: Emergency Medicine | Admitting: Emergency Medicine

## 2020-06-03 ENCOUNTER — Other Ambulatory Visit: Payer: Self-pay

## 2020-06-03 DIAGNOSIS — N3001 Acute cystitis with hematuria: Secondary | ICD-10-CM | POA: Diagnosis not present

## 2020-06-03 LAB — POCT URINALYSIS DIP (MANUAL ENTRY)
Bilirubin, UA: NEGATIVE
Glucose, UA: NEGATIVE mg/dL
Ketones, POC UA: NEGATIVE mg/dL
Nitrite, UA: NEGATIVE
Protein Ur, POC: 30 mg/dL — AB
Spec Grav, UA: >=1.03 — AB (ref 1.010–1.025)
Urobilinogen, UA: 0.2 E.U./dL
pH, UA: 5.5 (ref 5.0–8.0)

## 2020-06-03 MED ORDER — SULFAMETHOXAZOLE-TRIMETHOPRIM 800-160 MG PO TABS: 1.0000 | ORAL_TABLET | Freq: Two times a day (BID) | ORAL | 0 refills | Status: AC

## 2020-06-03 NOTE — ED Triage Notes (Signed)
Pt c/o burning on urination with blood in urine since last night with urgency and frequency. States hx of a kidney infection and these were his early sx's. States did a tele doc last night and rx cipro but its on backorder at all [facility].

## 2020-06-03 NOTE — Discharge Instructions (Addendum)
Take antibiotic twice daily with food. Important to drink plenty of water throughout the day. Return for worsening urinary symptoms, blood in urine, abdominal or back pain, fever. 

## 2020-06-03 NOTE — ED Provider Notes (Signed)
[facility]    CSN: [id number] Arrival date & time: 06/03/20  3846      History   Chief Complaint Chief Complaint  Patient presents with  . Urinary Tract Infection    HPI Joshua Hayden is a 34 y.o. male  Sending for possible UTI.  Endorsing burning with urination, blood in his urine (yesterday, resolved now), urgency and frequency.  Does endorse history of pyelonephritis.  Endorsing right flank pain.  Denies penile discharge, testicular pain or swelling.  Did a telemedicine visit last night: Given Cipro, though on back order.  Denies anal or rectal pain, history of prostatitis.  History reviewed. No pertinent past medical history.  There are no problems to display for this patient.   History reviewed. No pertinent surgical history.     Home Medications    Prior to Admission medications   Medication Sig Start Date End Date Taking? Authorizing Provider  sulfamethoxazole-trimethoprim (BACTRIM DS) 800-160 MG tablet Take 1 tablet by mouth 2 (two) times daily for 7 days. 06/03/20 06/10/20 Yes Hall-Potvin, [place], PA-C    Family History History reviewed. No pertinent family history.  Social History Social History   Tobacco Use  . Smoking status: Never Smoker  . Smokeless tobacco: Never Used     Allergies   Penicillins   Review of Systems As per HPI   Physical Exam Triage Vital Signs ED Triage Vitals  Enc Vitals Group     BP 06/03/20 0933 (!) 145/81     Pulse Rate 06/03/20 0933 75     Resp 06/03/20 0933 16     Temp 06/03/20 0933 97.8 F (36.6 C)     Temp Source 06/03/20 0933 Oral     SpO2 06/03/20 0933 96 %     Weight --      Height --      Head Circumference --      Peak Flow --      Pain Score 06/03/20 0945 0     Pain Loc --      Pain Edu? --      Excl. in GC? --    No data found.  Updated Vital Signs BP (!) 145/81 (BP Location: Left Arm)   Pulse 75   Temp 97.8 F (36.6 C) (Oral)   Resp 16   SpO2 96%   Visual Acuity Right  Eye Distance:   Left Eye Distance:   Bilateral Distance:    Right Eye Near:   Left Eye Near:    Bilateral Near:     Physical Exam Constitutional:      General: He is not in acute distress. HENT:     Head: Normocephalic and atraumatic.  Eyes:     General: No scleral icterus.    Pupils: Pupils are equal, round, and reactive to light.  Cardiovascular:     Rate and Rhythm: Normal rate.  Pulmonary:     Effort: Pulmonary effort is normal. No respiratory distress.     Breath sounds: No wheezing.  Skin:    Coloration: Skin is not jaundiced or pale.  Neurological:     Mental Status: He is alert and oriented to person, place, and time.      UC Treatments / Results  Labs (all labs ordered are listed, but only abnormal results are displayed) Labs Reviewed  POCT URINALYSIS DIP (MANUAL ENTRY) - Abnormal; Notable for the following components:      Result Value   Clarity, UA cloudy (*)    Spec  Grav, UA >=1.030 (*)    Blood, UA large (*)    Protein Ur, POC =30 (*)    Leukocytes, UA Moderate (2+) (*)    All other components within normal limits  URINE CULTURE    EKG   Radiology No results found.  Procedures Procedures (including critical care time)  Medications Ordered in UC Medications - No data to display  Initial Impression / Assessment and Plan / UC Course  I have reviewed the triage vital signs and the nursing notes.  Pertinent labs & imaging results that were available during my care of the patient were reviewed by me and considered in my medical decision making (see chart for details).   Afebrile, nontoxic in office today.  Urine as above, culture pending.  Will start Bactrim, follow-up with his urologist for further evaluation and management if needed.  Return precautions discussed, pt verbalized understanding and is agreeable to plan. Final Clinical Impressions(s) / UC Diagnoses   Final diagnoses:  Acute cystitis with hematuria     Discharge Instructions      Take antibiotic twice daily with food. Important to drink plenty of water throughout the day. Return for worsening urinary symptoms, blood in urine, abdominal or back pain, fever.    ED Prescriptions    Medication Sig Dispense Auth. Provider   sulfamethoxazole-trimethoprim (BACTRIM DS) 800-160 MG tablet Take 1 tablet by mouth 2 (two) times daily for 7 days. 14 tablet Hall-Potvin, [place], PA-C     PDMP not reviewed this encounter.   Hall-Potvin, [place], [place] 06/03/20 1006

## 2020-06-05 LAB — URINE CULTURE: Culture: >=100000 — AB

## 2020-10-16 DIAGNOSIS — R030 Elevated blood-pressure reading, without diagnosis of hypertension: Secondary | ICD-10-CM | POA: Diagnosis not present

## 2020-10-16 DIAGNOSIS — Z1331 Encounter for screening for depression: Secondary | ICD-10-CM | POA: Diagnosis not present

## 2020-10-16 DIAGNOSIS — Z6836 Body mass index (BMI) 36.0-36.9, adult: Secondary | ICD-10-CM | POA: Diagnosis not present

## 2020-10-16 DIAGNOSIS — E669 Obesity, unspecified: Secondary | ICD-10-CM | POA: Diagnosis not present

## 2020-10-16 DIAGNOSIS — Z0000 Encounter for general adult medical examination without abnormal findings: Secondary | ICD-10-CM | POA: Diagnosis not present

## 2020-12-17 DIAGNOSIS — E669 Obesity, unspecified: Secondary | ICD-10-CM | POA: Diagnosis not present

## 2020-12-17 DIAGNOSIS — R7401 Elevation of levels of liver transaminase levels: Secondary | ICD-10-CM | POA: Diagnosis not present

## 2020-12-17 DIAGNOSIS — Z6833 Body mass index (BMI) 33.0-33.9, adult: Secondary | ICD-10-CM | POA: Diagnosis not present

## 2021-10-31 DIAGNOSIS — N390 Urinary tract infection, site not specified: Secondary | ICD-10-CM | POA: Diagnosis not present

## 2022-08-23 DIAGNOSIS — S46001A Unspecified injury of muscle(s) and tendon(s) of the rotator cuff of right shoulder, initial encounter: Secondary | ICD-10-CM | POA: Diagnosis not present

## 2022-09-01 DIAGNOSIS — M25511 Pain in right shoulder: Secondary | ICD-10-CM | POA: Diagnosis not present

## 2022-09-08 DIAGNOSIS — M75101 Unspecified rotator cuff tear or rupture of right shoulder, not specified as traumatic: Secondary | ICD-10-CM | POA: Diagnosis not present

## 2022-11-11 DIAGNOSIS — Z0000 Encounter for general adult medical examination without abnormal findings: Secondary | ICD-10-CM | POA: Diagnosis not present

## 2022-11-11 DIAGNOSIS — Z13220 Encounter for screening for lipoid disorders: Secondary | ICD-10-CM | POA: Diagnosis not present

## 2022-11-11 DIAGNOSIS — R6882 Decreased libido: Secondary | ICD-10-CM | POA: Diagnosis not present
